# Patient Record
Sex: Female | Born: 1975
Health system: Southern US, Community
[De-identification: ages and names within clinical notes are randomized; demographics above are authoritative.]

## PROBLEM LIST (undated history)

## (undated) DIAGNOSIS — F419 Anxiety disorder, unspecified: Secondary | ICD-10-CM

## (undated) DIAGNOSIS — R569 Unspecified convulsions: Secondary | ICD-10-CM

## (undated) DIAGNOSIS — F429 Obsessive-compulsive disorder, unspecified: Secondary | ICD-10-CM

## (undated) HISTORY — PX: TONSILLECTOMY: SUR1361

## (undated) HISTORY — DX: Anxiety disorder, unspecified: F41.9

## (undated) HISTORY — DX: Unspecified convulsions: R56.9

## (undated) HISTORY — PX: BREAST LUMPECTOMY: SHX2

## (undated) HISTORY — DX: Obsessive-compulsive disorder, unspecified: F42.9

---

## 2004-01-07 ENCOUNTER — Other Ambulatory Visit: Admission: RE | Admit: 2004-01-07 | Discharge: 2004-01-07 | Payer: Self-pay | Admitting: Family Medicine

## 2004-12-01 ENCOUNTER — Other Ambulatory Visit: Admission: RE | Admit: 2004-12-01 | Discharge: 2004-12-01 | Payer: Self-pay | Admitting: Family Medicine

## 2006-03-03 ENCOUNTER — Other Ambulatory Visit: Admission: RE | Admit: 2006-03-03 | Discharge: 2006-03-03 | Payer: Self-pay | Admitting: Family Medicine

## 2006-05-10 ENCOUNTER — Ambulatory Visit: Payer: Self-pay | Admitting: Family Medicine

## 2006-06-29 ENCOUNTER — Ambulatory Visit: Payer: Self-pay | Admitting: Family Medicine

## 2006-09-10 ENCOUNTER — Ambulatory Visit: Payer: Self-pay | Admitting: Family Medicine

## 2007-02-09 ENCOUNTER — Ambulatory Visit: Payer: Self-pay | Admitting: Family Medicine

## 2007-03-17 ENCOUNTER — Ambulatory Visit: Payer: Self-pay | Admitting: Family Medicine

## 2007-03-17 ENCOUNTER — Other Ambulatory Visit: Admission: RE | Admit: 2007-03-17 | Discharge: 2007-03-17 | Payer: Self-pay | Admitting: Family Medicine

## 2007-09-09 ENCOUNTER — Ambulatory Visit: Payer: Self-pay | Admitting: Family Medicine

## 2008-04-04 ENCOUNTER — Ambulatory Visit: Payer: Self-pay | Admitting: Family Medicine

## 2008-06-15 ENCOUNTER — Ambulatory Visit: Payer: Self-pay | Admitting: Family Medicine

## 2008-06-15 ENCOUNTER — Other Ambulatory Visit: Admission: RE | Admit: 2008-06-15 | Discharge: 2008-06-15 | Payer: Self-pay | Admitting: Family Medicine

## 2009-04-17 ENCOUNTER — Ambulatory Visit: Payer: Self-pay | Admitting: Family Medicine

## 2009-07-03 ENCOUNTER — Ambulatory Visit: Payer: Self-pay | Admitting: Family Medicine

## 2009-07-30 ENCOUNTER — Other Ambulatory Visit: Admission: RE | Admit: 2009-07-30 | Discharge: 2009-07-30 | Payer: Self-pay | Admitting: Obstetrics and Gynecology

## 2009-10-08 ENCOUNTER — Encounter: Admission: RE | Admit: 2009-10-08 | Discharge: 2009-10-08 | Payer: Self-pay | Admitting: Obstetrics and Gynecology

## 2009-11-29 ENCOUNTER — Encounter: Admission: RE | Admit: 2009-11-29 | Discharge: 2009-11-29 | Payer: Self-pay | Admitting: Surgery

## 2009-12-19 ENCOUNTER — Ambulatory Visit (HOSPITAL_BASED_OUTPATIENT_CLINIC_OR_DEPARTMENT_OTHER): Admission: RE | Admit: 2009-12-19 | Discharge: 2009-12-19 | Payer: Self-pay | Admitting: Surgery

## 2010-07-31 ENCOUNTER — Other Ambulatory Visit: Admission: RE | Admit: 2010-07-31 | Discharge: 2010-07-31 | Payer: Self-pay | Admitting: Obstetrics and Gynecology

## 2011-08-04 ENCOUNTER — Other Ambulatory Visit (HOSPITAL_COMMUNITY)
Admission: RE | Admit: 2011-08-04 | Discharge: 2011-08-04 | Disposition: A | Discharge status: Home / Self Care | Payer: Managed Care, Other (non HMO) | Source: Ambulatory Visit | Attending: Obstetrics and Gynecology | Admitting: Obstetrics and Gynecology

## 2011-08-04 ENCOUNTER — Other Ambulatory Visit: Payer: Self-pay | Admitting: Nurse Practitioner

## 2011-08-04 DIAGNOSIS — Z01419 Encounter for gynecological examination (general) (routine) without abnormal findings: Secondary | ICD-10-CM | POA: Insufficient documentation

## 2012-09-14 ENCOUNTER — Other Ambulatory Visit: Payer: Self-pay | Admitting: Family Medicine

## 2012-09-14 ENCOUNTER — Other Ambulatory Visit (HOSPITAL_COMMUNITY)
Admission: RE | Admit: 2012-09-14 | Discharge: 2012-09-14 | Disposition: A | Discharge status: Home / Self Care | Payer: Managed Care, Other (non HMO) | Source: Ambulatory Visit | Attending: Family Medicine | Admitting: Family Medicine

## 2012-09-14 DIAGNOSIS — Z124 Encounter for screening for malignant neoplasm of cervix: Secondary | ICD-10-CM | POA: Insufficient documentation

## 2013-12-12 ENCOUNTER — Other Ambulatory Visit: Payer: Self-pay | Admitting: Family Medicine

## 2013-12-12 ENCOUNTER — Other Ambulatory Visit (HOSPITAL_COMMUNITY)
Admission: RE | Admit: 2013-12-12 | Discharge: 2013-12-12 | Disposition: A | Discharge status: Home / Self Care | Payer: Managed Care, Other (non HMO) | Source: Ambulatory Visit | Attending: Family Medicine | Admitting: Family Medicine

## 2013-12-12 DIAGNOSIS — Z1231 Encounter for screening mammogram for malignant neoplasm of breast: Secondary | ICD-10-CM

## 2013-12-12 DIAGNOSIS — Z124 Encounter for screening for malignant neoplasm of cervix: Secondary | ICD-10-CM | POA: Insufficient documentation

## 2013-12-12 DIAGNOSIS — Z1151 Encounter for screening for human papillomavirus (HPV): Secondary | ICD-10-CM | POA: Insufficient documentation

## 2013-12-28 ENCOUNTER — Ambulatory Visit: Payer: Managed Care, Other (non HMO)

## 2014-01-15 ENCOUNTER — Ambulatory Visit
Admission: RE | Admit: 2014-01-15 | Discharge: 2014-01-15 | Disposition: A | Discharge status: Home / Self Care | Payer: Managed Care, Other (non HMO) | Source: Ambulatory Visit | Attending: Family Medicine | Admitting: Family Medicine

## 2014-01-15 DIAGNOSIS — Z1231 Encounter for screening mammogram for malignant neoplasm of breast: Secondary | ICD-10-CM

## 2014-01-16 ENCOUNTER — Other Ambulatory Visit: Payer: Self-pay | Admitting: Family Medicine

## 2014-01-16 DIAGNOSIS — R928 Other abnormal and inconclusive findings on diagnostic imaging of breast: Secondary | ICD-10-CM

## 2014-01-29 ENCOUNTER — Ambulatory Visit
Admission: RE | Admit: 2014-01-29 | Discharge: 2014-01-29 | Disposition: A | Discharge status: Home / Self Care | Payer: Managed Care, Other (non HMO) | Source: Ambulatory Visit | Attending: Family Medicine | Admitting: Family Medicine

## 2014-01-29 DIAGNOSIS — R928 Other abnormal and inconclusive findings on diagnostic imaging of breast: Secondary | ICD-10-CM

## 2014-12-13 ENCOUNTER — Other Ambulatory Visit (HOSPITAL_COMMUNITY)
Admission: RE | Admit: 2014-12-13 | Payer: Managed Care, Other (non HMO) | Source: Ambulatory Visit | Admitting: Family Medicine

## 2014-12-13 ENCOUNTER — Other Ambulatory Visit: Payer: Self-pay | Admitting: Family Medicine

## 2014-12-13 DIAGNOSIS — Z124 Encounter for screening for malignant neoplasm of cervix: Secondary | ICD-10-CM | POA: Diagnosis present

## 2014-12-17 LAB — CYTOLOGY - PAP

## 2015-01-09 ENCOUNTER — Ambulatory Visit (INDEPENDENT_AMBULATORY_CARE_PROVIDER_SITE_OTHER): Payer: Managed Care, Other (non HMO) | Admitting: Neurology

## 2015-01-09 ENCOUNTER — Telehealth: Payer: Self-pay | Admitting: Neurology

## 2015-01-09 ENCOUNTER — Encounter: Payer: Self-pay | Admitting: Neurology

## 2015-01-09 VITALS — BP 116/70 | HR 72 | Ht 64.0 in | Wt 133.0 lb

## 2015-01-09 DIAGNOSIS — R569 Unspecified convulsions: Secondary | ICD-10-CM

## 2015-01-09 HISTORY — DX: Unspecified convulsions: R56.9

## 2015-01-09 NOTE — Telephone Encounter (Signed)
Pt is calling stating she thinks Dr. Anne HahnWillis told her to never take Wellburtin again.  She wants to make sure she understood correctly and if that is the case why can't she drive if she is not taking this medication if that is what caused the seizure.  Please call and advise.

## 2015-01-09 NOTE — Progress Notes (Signed)
Reason for visit: Seizure  Ashley Sweeney is a 39 y.o. female  History of present illness:  Ms. Klausner is a 39 year old right-handed white female with a history of obsessive compulsive disorder and anxiety. The patient had been on Wellbutrin for this issue, taking 300 mg daily. The patient increased the dose from 300 mg to 450 mg daily dose around the middle of February 2016. On the 26th of February, the patient was at work, alone in her office. The patient noted a period of lost time. The patient awakened to find that the computer had timed out, and she felt confused, she could not remember her pass code to get back into her computer. The patient then noted that she had become incontinent of the urine, and did not know why. When she went home that day, she noted an ecchymosis on the left forehead. The patient had not bitten her tongue. The patient did note muscle soreness over the next 2 days particularly involving the jaw, and the right arm. The patient sought medical attention through her primary care physician who suspected that a seizure event had occurred. The patient was taken off of her Wellbutrin, and MRI evaluation of the brain was done. This study appears to be normal. The report was made available to me. The patient was told not to operate a motor vehicle. She is sent to this office for an evaluation. She has never had seizures before, there is no family history of seizures.  Past Medical History  Diagnosis Date  . Anxiety   . Convulsions/seizures 01/09/2015  . OCD (obsessive compulsive disorder)     Past Surgical History  Procedure Laterality Date  . Breast lumpectomy  2011-2012  . Tonsillectomy      Family History  Problem Relation Age of Onset  . Prostate cancer Mother   . Diabetes Father   . Depression Father   . Diabetes Paternal Grandmother   . Diabetes Paternal Grandfather   . Seizures Neg Hx     Social history:  reports that she has quit smoking. She has never  used smokeless tobacco. She reports that she drinks alcohol. She reports that she does not use illicit drugs.  Medications:  Prior to Admission medications   Medication Sig Start Date End Date Taking? Authorizing Provider  SPRINTEC 28 0.25-35 MG-MCG tablet Take 1 tablet by mouth daily. 12/23/14  Yes Historical Provider, MD     No Known Allergies  ROS:  Out of a complete 14 system review of symptoms, the patient complains only of the following symptoms, and all other reviewed systems are negative.  Incontinence of bladder Memory loss, confusion, headache Dizziness, seizure  Blood pressure 116/70, pulse 72, height  (1.626 m), weight 133 lb (60.328 kg).  Physical Exam  General: The patient is alert and cooperative at the time of the examination.  Eyes: Pupils are equal, round, and reactive to light. Discs are flat bilaterally.  Neck: The neck is supple, no carotid bruits are noted.  Respiratory: The respiratory examination is clear.  Cardiovascular: The cardiovascular examination reveals a regular rate and rhythm, no obvious murmurs or rubs are noted.  Skin: Extremities are without significant edema.  Neurologic Exam  Mental status: The patient is alert and oriented x 3 at the time of the examination. The patient has apparent normal recent and remote memory, with an apparently normal attention span and concentration ability.  Cranial nerves: Facial symmetry is present. There is good sensation of the face  to pinprick and soft touch bilaterally. The strength of the facial muscles and the muscles to head turning and shoulder shrug are normal bilaterally. Speech is well enunciated, no aphasia or dysarthria is noted. Extraocular movements are full. Visual fields are full. The tongue is midline, and the patient has symmetric elevation of the soft palate. No obvious hearing deficits are noted.  Motor: The motor testing reveals 5 over 5 strength of all 4 extremities. Good symmetric  motor tone is noted throughout.  Sensory: Sensory testing is intact to pinprick, soft touch, vibration sensation, and position sense on all 4 extremities. No evidence of extinction is noted.  Coordination: Cerebellar testing reveals good finger-nose-finger and heel-to-shin bilaterally.  Gait and station: Gait is normal. Tandem gait is normal. Romberg is negative. No drift is seen.  Reflexes: Deep tendon reflexes are symmetric and normal bilaterally. Toes are downgoing bilaterally.   Assessment/Plan:  1. Seizure event  2. Obsessive-compulsive disorder  The patient likely had a provoked seizure episode. The seizure was likely brought on by a high dose of Wellbutrin. This medication has been stopped, and MRI brain evaluation has been done that was normal. The patient will be set up for an EEG study. If this is normal, the patient will be watched conservatively over the next 6 months. She is not to operate a motor vehicle for 6 months. At that point, she may return to driving if no further episodes have occurred. The patient will follow-up in 6 months.  Marlan Palau. Keith Willis MD 01/09/2015 9:09 PM  [facility] [address] [place], [place] [id number]  Phone [phone] Fax [phone]

## 2015-01-09 NOTE — Patient Instructions (Signed)

## 2015-01-09 NOTE — Telephone Encounter (Signed)
I called the patient. The patient will need to have a 6 month waiting period regardless of whether she goes on medication or not. I think that it is possible that the Wellbutrin was the cause of the seizure, I cannot be sure of this. If the EEG study is abnormal, she will be placed on seizure medications, she will still have to wait 6 months to drive. If the EEG is normal, she will still have to wait 6 months to drive, if she wishes to go back on Wellbutrin she may, but she must understand that being on Wellbutrin does increase the risk of having another seizure. It is possible that being on a lower dose such as 150 mg daily for 300 mg daily of Wellbutrin may be tolerated without recurrence of seizures.

## 2015-01-16 ENCOUNTER — Ambulatory Visit (INDEPENDENT_AMBULATORY_CARE_PROVIDER_SITE_OTHER): Payer: Managed Care, Other (non HMO) | Admitting: Neurology

## 2015-01-16 ENCOUNTER — Telehealth: Payer: Self-pay | Admitting: Neurology

## 2015-01-16 DIAGNOSIS — R569 Unspecified convulsions: Secondary | ICD-10-CM

## 2015-01-16 NOTE — Procedures (Signed)
    History:  Ashley Sweeney is a [age] year old patient with a history of anxiety and OCD disorder on Wellbutrin. The patient had gone to a 450 mg daily dose of the medication, and on February 26, she experienced an episode of "lost time" likely representing an unwitnessed seizure. The patient is being evaluated for this episode.  This is a routine EEG. No skull defects are noted. Medications include birth control pills.   EEG classification: Normal awake and asleep  Description of the recording: The background rhythms of this recording consists of a fairly well modulated medium amplitude background activity of 9 Hz. As the record progresses, the patient initially is in the waking state, but appears to enter the early stage II sleep during the recording, with rudimentary sleep spindles and vertex sharp wave activity seen. During the wakeful state, photic stimulation is performed, and this results in a bilateral and symmetric photic driving response. Hyperventilation was also performed, and this results in a minimal buildup of the background rhythm activities without significant slowing seen. At no time during the recording does there appear to be evidence of spike or spike wave discharges or evidence of focal slowing. EKG monitor shows no evidence of cardiac rhythm abnormalities with a heart rate of 84.  Impression: This is a normal EEG recording in the waking and sleeping state. No evidence of ictal or interictal discharges were seen at any time during the recording.

## 2015-01-16 NOTE — Telephone Encounter (Signed)
I called patient. The brain wave test was unremarkable. At this point, we will watch the patient with a neck several months, she has no seizures after 6 months, she may return to driving.

## 2015-07-10 ENCOUNTER — Encounter: Payer: Self-pay | Admitting: Neurology

## 2015-07-10 ENCOUNTER — Ambulatory Visit (INDEPENDENT_AMBULATORY_CARE_PROVIDER_SITE_OTHER): Payer: Managed Care, Other (non HMO) | Admitting: Neurology

## 2015-07-10 VITALS — BP 119/78 | HR 72 | Ht 63.0 in | Wt 145.5 lb

## 2015-07-10 DIAGNOSIS — R569 Unspecified convulsions: Secondary | ICD-10-CM

## 2015-07-10 NOTE — Patient Instructions (Signed)
   Please call if you have had another seizure type episode.

## 2015-07-10 NOTE — Progress Notes (Signed)
Reason for visit: Seizures  Ashley Sweeney is an 39 y.o. female  History of present illness:  Ashley Sweeney is a 39 year old right-handed white female with a history of a single seizure event that occurred on 12/28/2014. The patient had been on Wellbutrin 450 mg daily at the time of the seizure. The patient has undergone MRI evaluation of the brain that was unremarkable, and an EEG study was normal. The patient had gone off of Wellbutrin and, but apparently she has not done well in regards to her depression issue. The patient is now back on Wellbutrin on the short acting tablets taking 75 mg twice daily. She indicates that she has had no further seizure-type episodes. She returns for an evaluation.  Past Medical History  Diagnosis Date  . Anxiety   . Convulsions/seizures 01/09/2015  . OCD (obsessive compulsive disorder)     Past Surgical History  Procedure Laterality Date  . Breast lumpectomy  2011-2012  . Tonsillectomy      Family History  Problem Relation Age of Onset  . Prostate cancer Mother   . Diabetes Father   . Depression Father   . Diabetes Paternal Grandmother   . Diabetes Paternal Grandfather   . Seizures Neg Hx   . Breast cancer Cousin     Social history:  reports that she has quit smoking. She has never used smokeless tobacco. She reports that she drinks about 4.2 oz of alcohol per week. She reports that she does not use illicit drugs.   No Known Allergies  Medications:  Prior to Admission medications   Medication Sig Start Date End Date Taking? Authorizing Provider  buPROPion (WELLBUTRIN XL) 150 MG 24 hr tablet Take 150 mg by mouth daily.   Yes Historical Provider, MD  SPRINTEC 28 0.25-35 MG-MCG tablet Take 1 tablet by mouth daily. 12/23/14  Yes Historical Provider, MD    ROS:  Out of a complete 14 system review of symptoms, the patient complains only of the following symptoms, and all other reviewed systems are negative.  Depression, anxiety  Blood  pressure 119/78, pulse 72, height  (1.6 m), weight 145 lb 8 oz (65.998 kg).  Physical Exam  General: The patient is alert and cooperative at the time of the examination.  Skin: No significant peripheral edema is noted.   Neurologic Exam  Mental status: The patient is alert and oriented x 3 at the time of the examination. The patient has apparent normal recent and remote memory, with an apparently normal attention span and concentration ability.   Cranial nerves: Facial symmetry is present. Speech is normal, no aphasia or dysarthria is noted. Extraocular movements are full. Visual fields are full.  Motor: The patient has good strength in all 4 extremities.  Sensory examination: Soft touch sensation is symmetric on the face, arms, and legs.  Coordination: The patient has good finger-nose-finger and heel-to-shin bilaterally.  Gait and station: The patient has a normal gait. Tandem gait is normal. Romberg is negative. No drift is seen.  Reflexes: Deep tendon reflexes are symmetric.   Assessment/Plan:  1. Seizure event  The patient is now back on Wellbutrin, she is on the short acting tablet. If she is forced to use his medication, I would use the lowest dose that is effective. I would also recommend switching to the extended-release preparation of the medication, as this has a lower incidence of seizure induction. The patient may return to driving at this point, she will follow-up through this office  on an as-needed basis.  Marlan Palau MD 07/10/2015 8:31 PM  [facility] [address] [place], [place] [address]  Phone [phone] Fax [phone]

## 2016-08-11 ENCOUNTER — Encounter (HOSPITAL_COMMUNITY): Payer: Self-pay | Admitting: Emergency Medicine

## 2016-08-11 ENCOUNTER — Emergency Department (HOSPITAL_COMMUNITY): Payer: BLUE CROSS/BLUE SHIELD

## 2016-08-11 ENCOUNTER — Emergency Department (HOSPITAL_COMMUNITY)
Admission: EM | Admit: 2016-08-11 | Discharge: 2016-08-11 | Disposition: A | Discharge status: Home / Self Care | Payer: BLUE CROSS/BLUE SHIELD | Attending: Emergency Medicine | Admitting: Emergency Medicine

## 2016-08-11 DIAGNOSIS — R109 Unspecified abdominal pain: Secondary | ICD-10-CM | POA: Diagnosis present

## 2016-08-11 DIAGNOSIS — Z79899 Other long term (current) drug therapy: Secondary | ICD-10-CM | POA: Insufficient documentation

## 2016-08-11 DIAGNOSIS — Z87891 Personal history of nicotine dependence: Secondary | ICD-10-CM | POA: Insufficient documentation

## 2016-08-11 LAB — URINALYSIS, ROUTINE W REFLEX MICROSCOPIC
BILIRUBIN URINE: NEGATIVE
Glucose, UA: NEGATIVE mg/dL
Hgb urine dipstick: NEGATIVE
Ketones, ur: NEGATIVE mg/dL
LEUKOCYTES UA: NEGATIVE
NITRITE: NEGATIVE
PH: 6 (ref 5.0–8.0)
Protein, ur: NEGATIVE mg/dL
SPECIFIC GRAVITY, URINE: 1.029 (ref 1.005–1.030)

## 2016-08-11 LAB — CBC
HCT: 38.6 % (ref 36.0–46.0)
Hemoglobin: 12.7 g/dL (ref 12.0–15.0)
MCH: 28.7 pg (ref 26.0–34.0)
MCHC: 32.9 g/dL (ref 30.0–36.0)
MCV: 87.3 fL (ref 78.0–100.0)
PLATELETS: 328 10*3/uL (ref 150–400)
RBC: 4.42 MIL/uL (ref 3.87–5.11)
RDW: 12.7 % (ref 11.5–15.5)
WBC: 12.5 10*3/uL — ABNORMAL HIGH (ref 4.0–10.5)

## 2016-08-11 LAB — BASIC METABOLIC PANEL
Anion gap: 7 (ref 5–15)
BUN: 16 mg/dL (ref 6–20)
CHLORIDE: 107 mmol/L (ref 101–111)
CO2: 25 mmol/L (ref 22–32)
CREATININE: 0.7 mg/dL (ref 0.44–1.00)
Calcium: 8.9 mg/dL (ref 8.9–10.3)
GFR calc non Af Amer: >60 mL/min (ref >60)
GLUCOSE: 138 mg/dL — AB (ref 65–99)
Potassium: 3.9 mmol/L (ref 3.5–5.1)
Sodium: 139 mmol/L (ref 135–145)

## 2016-08-11 LAB — I-STAT TROPONIN, ED: Troponin i, poc: 0.01 ng/mL (ref 0.00–0.08)

## 2016-08-11 LAB — PREGNANCY, URINE: Preg Test, Ur: NEGATIVE

## 2016-08-11 MED ORDER — GI COCKTAIL ~~LOC~~: 30.0000 mL | Freq: Once | ORAL | Status: DC

## 2016-08-11 MED ORDER — ONDANSETRON HCL 4 MG PO TABS: 4.0000 mg | ORAL_TABLET | Freq: Four times a day (QID) | ORAL | 0 refills | Status: AC | PRN

## 2016-08-11 MED ORDER — PANTOPRAZOLE SODIUM 40 MG PO TBEC
40.0000 mg | DELAYED_RELEASE_TABLET | Freq: Once | ORAL | Status: AC
  Administered 2016-08-11: 40 mg via ORAL
  Filled 2016-08-11: qty 1

## 2016-08-11 MED ORDER — ONDANSETRON HCL 4 MG/2ML IJ SOLN
4.0000 mg | Freq: Once | INTRAMUSCULAR | Status: AC
  Administered 2016-08-11: 4 mg via INTRAVENOUS
  Filled 2016-08-11: qty 2

## 2016-08-11 MED ORDER — MORPHINE SULFATE (PF) 4 MG/ML IV SOLN
4.0000 mg | Freq: Once | INTRAVENOUS | Status: AC
Start: 2016-08-11 — End: 2016-08-11
  Administered 2016-08-11: 4 mg via INTRAVENOUS
  Filled 2016-08-11: qty 1

## 2016-08-11 MED ORDER — PANTOPRAZOLE SODIUM 40 MG PO TBEC: 40.0000 mg | DELAYED_RELEASE_TABLET | Freq: Every day | ORAL | 0 refills | Status: AC

## 2016-08-11 MED ORDER — KETOROLAC TROMETHAMINE 30 MG/ML IJ SOLN
30.0000 mg | Freq: Once | INTRAMUSCULAR | Status: AC
  Administered 2016-08-11: 30 mg via INTRAVENOUS
  Filled 2016-08-11: qty 1

## 2016-08-11 MED ORDER — OXYCODONE-ACETAMINOPHEN 5-325 MG PO TABS: 1.0000 | ORAL_TABLET | ORAL | 0 refills | Status: AC | PRN

## 2016-08-11 NOTE — ED Provider Notes (Signed)
[facility]-EMERGENCY DEPT Provider Note   CSN: [id number] Arrival date & time: 08/11/16  0411     History   Chief Complaint Chief Complaint  Patient presents with  . Chest Pain  . Flank Pain    HPI Ashley Sweeney is a 40 y.o. female.  She has a history of seizures. She woke up at about 3:30 AM with pain in the right side of her back and into her chest. Pain is worse in the lower rib cage but does takes and both superiorly and inferiorly. There was no definite dyspnea. She tried drinking a lot of water and ate some Tums and didn't vomit. She momentarily felt better but then pain got worse again. She rates pain at 6/10. She denies fever or chills but has broken out in sweats. She denies any urinary difficulty. She's never had pain like this before.   The history is provided by the patient.    Past Medical History:  Diagnosis Date  . Anxiety   . Convulsions/seizures (HCC) 01/09/2015  . OCD (obsessive compulsive disorder)     Patient Active Problem List   Diagnosis Date Noted  . Convulsions/seizures (HCC) 01/09/2015    Past Surgical History:  Procedure Laterality Date  . BREAST LUMPECTOMY  2011-2012  . TONSILLECTOMY      OB History    No data available       Home Medications    Prior to Admission medications   Medication Sig Start Date End Date Taking? Authorizing Provider  buPROPion (WELLBUTRIN XL) 150 MG 24 hr tablet Take 150 mg by mouth daily.    Historical Provider, MD  SPRINTEC 28 0.25-35 MG-MCG tablet Take 1 tablet by mouth daily. 12/23/14   Historical Provider, MD    Family History Family History  Problem Relation Age of Onset  . Prostate cancer Mother   . Diabetes Father   . Depression Father   . Diabetes Paternal Grandmother   . Diabetes Paternal Grandfather   . Breast cancer Cousin   . Seizures Neg Hx     Social History Social History  Substance Use Topics  . Smoking status: Former Smoker    Quit date: 08/02/2010  . Smokeless tobacco: Never  Used  . Alcohol use 0.0 oz/week     Comment: 1-2 glasses of wine a night     Allergies   Review of patient's allergies indicates no known allergies.   Review of Systems Review of Systems  All other systems reviewed and are negative.    Physical Exam Updated Vital Signs BP 99/86 (BP Location: Left Arm)   Pulse 77   Temp 97.6 F (36.4 C)   Resp 15   Ht 5\' 3"  (1.6 m)   Wt 140 lb (63.5 kg)   LMP 07/28/2016 (Approximate)   SpO2 100%   BMI 24.80 kg/m   Physical Exam  Nursing note and vitals reviewed.  40 year old female, resting comfortably and in no acute distress. Vital signs are Normal. Oxygen saturation is 100%, which is normal. Head is normocephalic and atraumatic. PERRLA, EOMI. Oropharynx is clear. Neck is nontender and supple without adenopathy or JVD. Back is nontender in the midline. There is mild right CVA tenderness. Lungs are clear without rales, wheezes, or rhonchi. Chest is nontender. Heart has regular rate and rhythm without murmur. Abdomen is soft, flat, nontender without masses or hepatosplenomegaly and peristalsis is normoactive. Extremities have no cyanosis or edema, full range of motion is present. Skin is warm and dry without  rash. Neurologic: Mental status is normal, cranial nerves are intact, there are no motor or sensory deficits.  ED Treatments / Results  Labs (all labs ordered are listed, but only abnormal results are displayed) Labs Reviewed  BASIC METABOLIC PANEL - Abnormal; Notable for the following:       Result Value   Glucose, Bld 138 (*)    All other components within normal limits  CBC - Abnormal; Notable for the following:    WBC 12.5 (*)    All other components within normal limits  URINALYSIS, ROUTINE W REFLEX MICROSCOPIC (NOT AT [facility]) - Abnormal; Notable for the following:    APPearance CLOUDY (*)    All other components within normal limits  PREGNANCY, URINE  I-STAT TROPOININ, ED  POC URINE PREG, ED    EKG  EKG  Interpretation  Date/Time:  Tuesday August 11 2016 04:20:17 EDT Ventricular Rate:  67 PR Interval:    QRS Duration: 75 QT Interval:  410 QTC Calculation: 433 R Axis:   77 Text Interpretation:  Sinus rhythm Borderline prolonged PR interval Otherwise within normal limits No old tracing to compare Confirmed by [facility]  MD, Aaniyah Strohm ([id number]) on 08/11/2016 4:37:55 AM       Radiology Dg Chest 2 View  Result Date: 08/11/2016 CLINICAL DATA:  Awoke with chest pain. EXAM: CHEST  2 VIEW COMPARISON:  None. FINDINGS: The cardiomediastinal contours are normal. The lungs are clear. Pulmonary vasculature is normal. No consolidation, pleural effusion, or pneumothorax. No acute osseous abnormalities are seen. IMPRESSION: No acute pulmonary process. Electronically Signed   By: Rubye Oaks M.D.   On: 08/11/2016 04:37    Procedures Procedures (including critical care time)  Medications Ordered in ED Medications  gi cocktail (Maalox,Lidocaine,Donnatal) (30 mLs Oral Not Given 08/11/16 0757)  ketorolac (TORADOL) 30 MG/ML injection 30 mg (30 mg Intravenous Given 08/11/16 0524)  ondansetron (ZOFRAN) injection 4 mg (4 mg Intravenous Given 08/11/16 0524)  morphine 4 MG/ML injection 4 mg (4 mg Intravenous Given 08/11/16 0741)  pantoprazole (PROTONIX) EC tablet 40 mg (40 mg Oral Given 08/11/16 1914)     Initial Impression / Assessment and Plan / ED Course  I have reviewed the triage vital signs and the nursing notes.  Pertinent labs & imaging results that were available during my care of the patient were reviewed by me and considered in my medical decision making (see chart for details).  Clinical Course   Back pain which does seem to be localized in the region around the lower rib cage/costovertebral angle area. I am suspicious this may be renal colic. ECG is unremarkable and chest x-ray is normal. She will be given ketorolac and ondansetron and sent for renal stone protocol CT scan.  Workup has come  back unremarkable. CT shows no evidence of urolithiasis. Urinalysis is unremarkable. CBC and metabolic panel were unremarkable. Patient states that pain has now moved to more in her epigastric area although still somewhat in her back but closer to the midline. On reexam, abdomen is tender in the epigastric area. I am suspicious that symptoms are related to GERD or ulcers or gastritis. GI cocktail was ordered but patient has refused. She was given a dose of morphine with significant relief of pain. Abdomen is still benign and workup has not shown any evidence of any serious pathology. She is discharged with prescription for pantoprazole as well as a small number of ondansetron and oxycodone-acetaminophen. Follow-up with PCP in 2 days with strict return precautions given.  Final  Clinical Impressions(s) / ED Diagnoses   Final diagnoses:  Right flank pain  Abdominal pain, unspecified abdominal location    New Prescriptions New Prescriptions   ONDANSETRON (ZOFRAN) 4 MG TABLET    Take 1 tablet (4 mg total) by mouth every 6 (six) hours as needed for nausea or vomiting.   OXYCODONE-ACETAMINOPHEN (PERCOCET) 5-325 MG TABLET    Take 1 tablet by mouth every 4 (four) hours as needed for moderate pain.   PANTOPRAZOLE (PROTONIX) 40 MG TABLET    Take 1 tablet (40 mg total) by mouth daily.     Dione Booze, MD 08/11/16 [phone]

## 2016-08-11 NOTE — ED Notes (Signed)
Discharge instructions, follow up care, and rx x3 reviewed with patient. Patient verbalized understanding. 

## 2016-08-11 NOTE — Discharge Instructions (Signed)
Return if symptoms are getting worse. °

## 2016-08-11 NOTE — ED Triage Notes (Signed)
Pt comes from home with complaints of chest pain and right sided flank pain that began upon awakening this morning around 0345.  Stated she also became very thirsty and drank a lot of water which ultimately caused her to throw up.  Pt states she has some shortness of breath and has a history of smoking.

## 2016-08-13 ENCOUNTER — Other Ambulatory Visit: Payer: Self-pay | Admitting: Family Medicine

## 2016-08-13 DIAGNOSIS — R1011 Right upper quadrant pain: Secondary | ICD-10-CM

## 2016-08-14 ENCOUNTER — Ambulatory Visit
Admission: RE | Admit: 2016-08-14 | Discharge: 2016-08-14 | Disposition: A | Discharge status: Home / Self Care | Payer: BLUE CROSS/BLUE SHIELD | Source: Ambulatory Visit | Attending: Family Medicine | Admitting: Family Medicine

## 2016-08-14 DIAGNOSIS — R1011 Right upper quadrant pain: Secondary | ICD-10-CM

## 2016-10-16 ENCOUNTER — Other Ambulatory Visit: Payer: Self-pay | Admitting: General Surgery

## 2017-03-17 ENCOUNTER — Other Ambulatory Visit: Payer: Self-pay | Admitting: Family Medicine

## 2017-03-17 DIAGNOSIS — Z1231 Encounter for screening mammogram for malignant neoplasm of breast: Secondary | ICD-10-CM

## 2018-06-24 ENCOUNTER — Other Ambulatory Visit: Payer: Self-pay | Admitting: Family Medicine

## 2018-06-24 DIAGNOSIS — Z1231 Encounter for screening mammogram for malignant neoplasm of breast: Secondary | ICD-10-CM

## 2018-09-01 ENCOUNTER — Ambulatory Visit
Admission: RE | Admit: 2018-09-01 | Discharge: 2018-09-01 | Disposition: A | Discharge status: Home / Self Care | Payer: Commercial Managed Care - PPO | Source: Ambulatory Visit | Attending: Family Medicine | Admitting: Family Medicine

## 2018-09-01 DIAGNOSIS — Z1231 Encounter for screening mammogram for malignant neoplasm of breast: Secondary | ICD-10-CM

## 2019-08-14 ENCOUNTER — Other Ambulatory Visit: Payer: Self-pay | Admitting: Family Medicine

## 2019-08-14 DIAGNOSIS — Z1231 Encounter for screening mammogram for malignant neoplasm of breast: Secondary | ICD-10-CM

## 2019-09-13 ENCOUNTER — Other Ambulatory Visit: Payer: Self-pay

## 2019-09-13 DIAGNOSIS — Z20822 Contact with and (suspected) exposure to covid-19: Secondary | ICD-10-CM

## 2019-09-15 LAB — NOVEL CORONAVIRUS, NAA: SARS-CoV-2, NAA: NOT DETECTED

## 2019-09-27 ENCOUNTER — Other Ambulatory Visit: Payer: Self-pay

## 2019-09-27 ENCOUNTER — Ambulatory Visit
Admission: RE | Admit: 2019-09-27 | Discharge: 2019-09-27 | Disposition: A | Discharge status: Home / Self Care | Payer: Commercial Managed Care - PPO | Source: Ambulatory Visit | Attending: Family Medicine | Admitting: Family Medicine

## 2019-09-27 DIAGNOSIS — Z1231 Encounter for screening mammogram for malignant neoplasm of breast: Secondary | ICD-10-CM

## 2020-02-02 ENCOUNTER — Ambulatory Visit: Payer: Commercial Managed Care - PPO | Attending: Internal Medicine

## 2020-02-02 DIAGNOSIS — Z23 Encounter for immunization: Secondary | ICD-10-CM

## 2020-02-02 NOTE — Progress Notes (Signed)
   Covid-19 Vaccination Clinic  Name:  Ashley Sweeney    MRN: [id number] DOB: November 21, 1975  02/02/2020  [name] was observed post Covid-19 immunization for 15 minutes without incident. She was provided with Vaccine Information Sheet and instruction to access the V-Safe system.   [name] was instructed to call 911 with any severe reactions post vaccine: Marland Kitchen Difficulty breathing  . Swelling of face and throat  . A fast heartbeat  . A bad rash all over body  . Dizziness and weakness   Immunizations Administered    Name Date Dose VIS Date Route   Pfizer COVID-19 Vaccine 02/02/2020  2:58 PM 0.3 mL 10/13/2019 Intramuscular   Manufacturer: [employer], [employer]   Lot: [id number]   NDC: [address]-2

## 2020-02-26 ENCOUNTER — Ambulatory Visit: Payer: Commercial Managed Care - PPO | Attending: Internal Medicine

## 2020-02-26 DIAGNOSIS — Z23 Encounter for immunization: Secondary | ICD-10-CM

## 2020-02-26 NOTE — Progress Notes (Signed)
   Covid-19 Vaccination Clinic  Name:  Ashley Sweeney    MRN: [id number] DOB: [birth year]  02/26/2020  [name] was observed post Covid-19 immunization for 15 minutes without incident. She was provided with Vaccine Information Sheet and instruction to access the V-Safe system.   [name] was instructed to call 911 with any severe reactions post vaccine: Marland Kitchen Difficulty breathing  . Swelling of face and throat  . A fast heartbeat  . A bad rash all over body  . Dizziness and weakness   Immunizations Administered    Name Date Dose VIS Date Route   Pfizer COVID-19 Vaccine 02/26/2020  2:57 PM 0.3 mL 12/27/2018 Intramuscular   Manufacturer: [employer], [employer]   Lot: [id number]   NDC: [address]-7

## 2020-04-12 ENCOUNTER — Other Ambulatory Visit (HOSPITAL_COMMUNITY)
Admission: RE | Admit: 2020-04-12 | Discharge: 2020-04-12 | Disposition: A | Discharge status: Home / Self Care | Payer: Commercial Managed Care - PPO | Source: Ambulatory Visit | Attending: Family Medicine | Admitting: Family Medicine

## 2020-04-12 ENCOUNTER — Other Ambulatory Visit: Payer: Self-pay | Admitting: Family Medicine

## 2020-04-12 DIAGNOSIS — Z124 Encounter for screening for malignant neoplasm of cervix: Secondary | ICD-10-CM | POA: Insufficient documentation

## 2020-04-16 LAB — CYTOLOGY - PAP
Comment: NEGATIVE
Diagnosis: NEGATIVE
High risk HPV: NEGATIVE

## 2020-04-17 ENCOUNTER — Other Ambulatory Visit: Payer: Self-pay | Admitting: Family Medicine

## 2020-04-17 DIAGNOSIS — Z1231 Encounter for screening mammogram for malignant neoplasm of breast: Secondary | ICD-10-CM

## 2020-09-30 ENCOUNTER — Other Ambulatory Visit: Payer: Self-pay

## 2020-09-30 ENCOUNTER — Ambulatory Visit
Admission: RE | Admit: 2020-09-30 | Discharge: 2020-09-30 | Disposition: A | Discharge status: Home / Self Care | Payer: Commercial Managed Care - PPO | Source: Ambulatory Visit | Attending: Family Medicine | Admitting: Family Medicine

## 2020-09-30 DIAGNOSIS — Z1231 Encounter for screening mammogram for malignant neoplasm of breast: Secondary | ICD-10-CM

## 2021-09-03 ENCOUNTER — Other Ambulatory Visit: Payer: Self-pay | Admitting: Family Medicine

## 2021-09-03 DIAGNOSIS — Z1231 Encounter for screening mammogram for malignant neoplasm of breast: Secondary | ICD-10-CM

## 2021-10-07 ENCOUNTER — Other Ambulatory Visit: Payer: Self-pay

## 2021-10-07 ENCOUNTER — Ambulatory Visit
Admission: RE | Admit: 2021-10-07 | Discharge: 2021-10-07 | Disposition: A | Discharge status: Home / Self Care | Payer: Commercial Managed Care - PPO | Source: Ambulatory Visit | Attending: Family Medicine | Admitting: Family Medicine

## 2021-10-07 DIAGNOSIS — Z1231 Encounter for screening mammogram for malignant neoplasm of breast: Secondary | ICD-10-CM

## 2022-02-19 IMAGING — MG MM DIGITAL SCREENING BILAT W/ TOMO AND CAD
8 series · 8 of 24 positions shown · non-contrast
Comparison: Previous exam(s).

CLINICAL DATA: Screening.

EXAM:
DIGITAL SCREENING BILATERAL MAMMOGRAM WITH TOMOSYNTHESIS AND CAD
TECHNIQUE: Bilateral screening digital craniocaudal and mediolateral oblique
mammograms were obtained. Bilateral screening digital breast
tomosynthesis was performed. The images were evaluated with
computer-aided detection.

[R CC synth-2D]
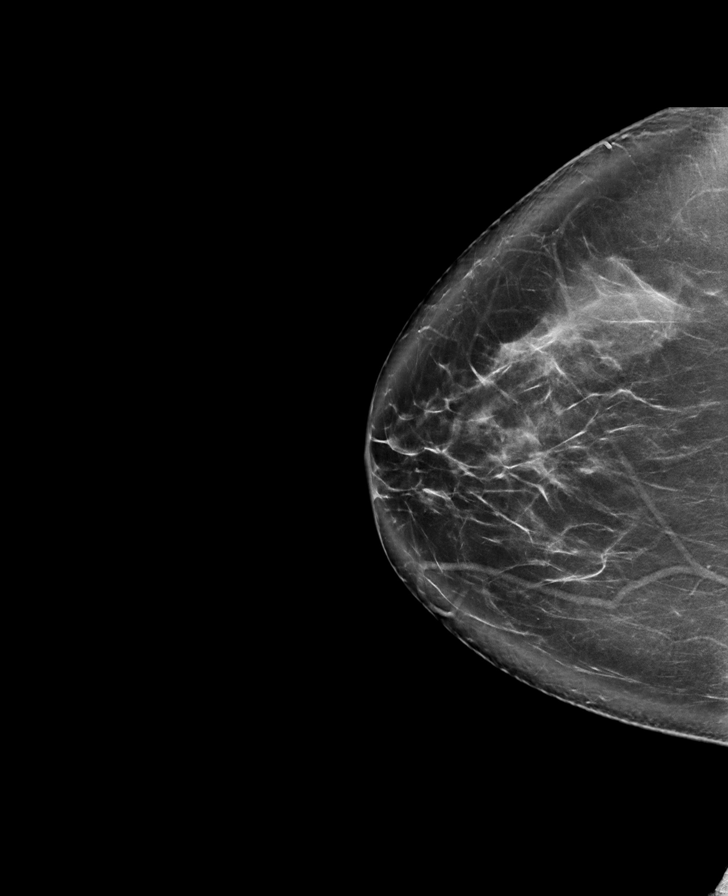

[L CC synth-2D]
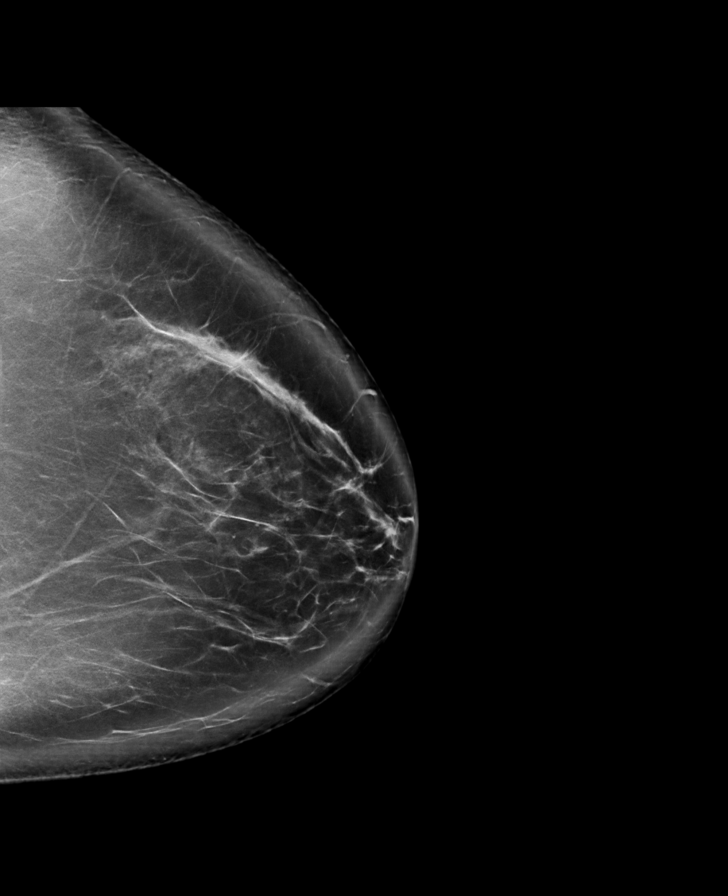

[L MLO synth-2D]
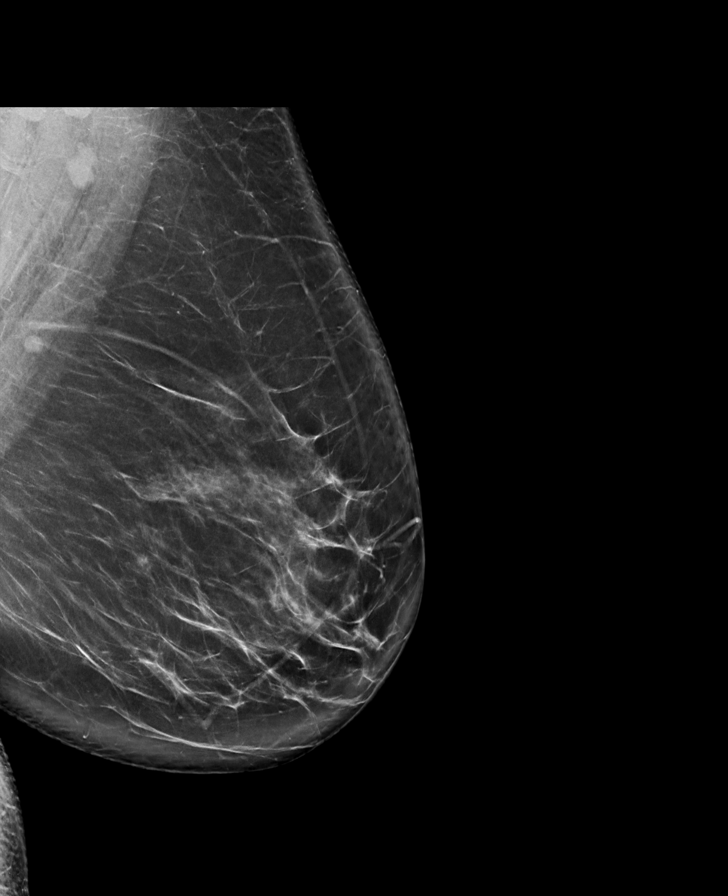

[R MLO synth-2D]
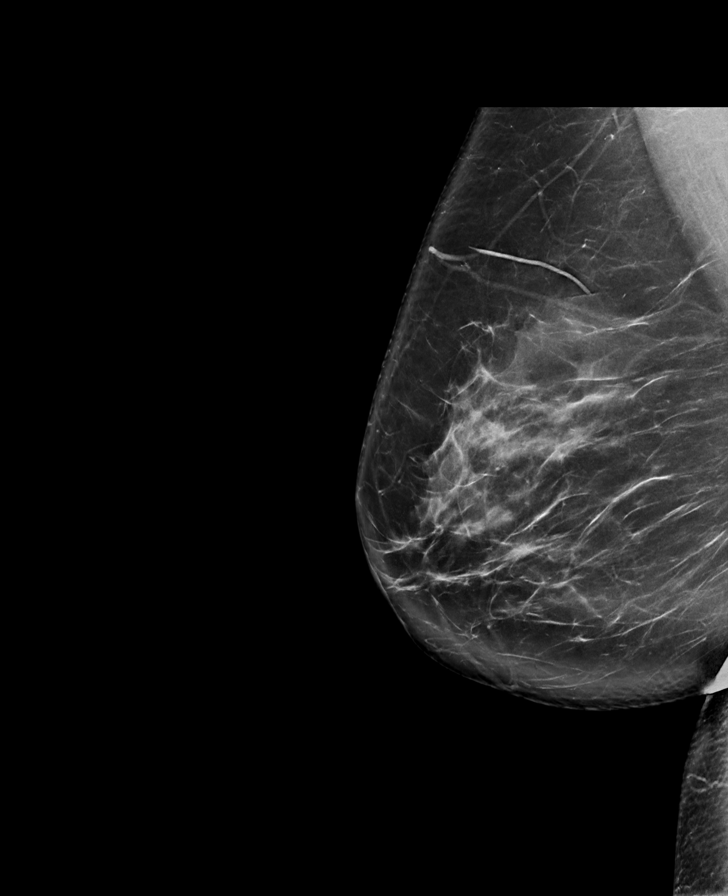

[L CC tomo · tomo slice 53/106.0]
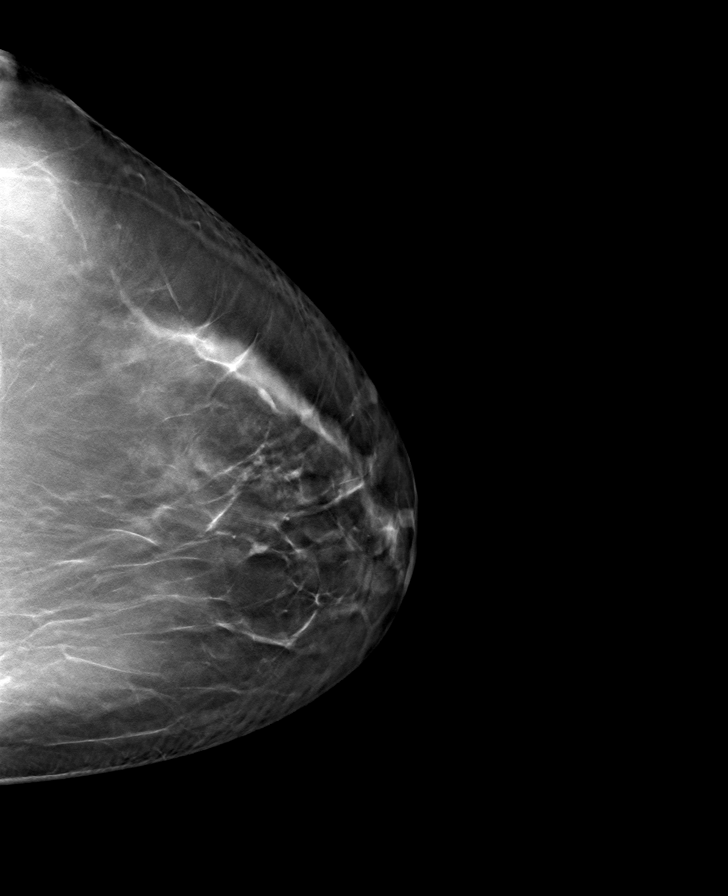

[R MLO tomo · tomo slice 51/100.0]
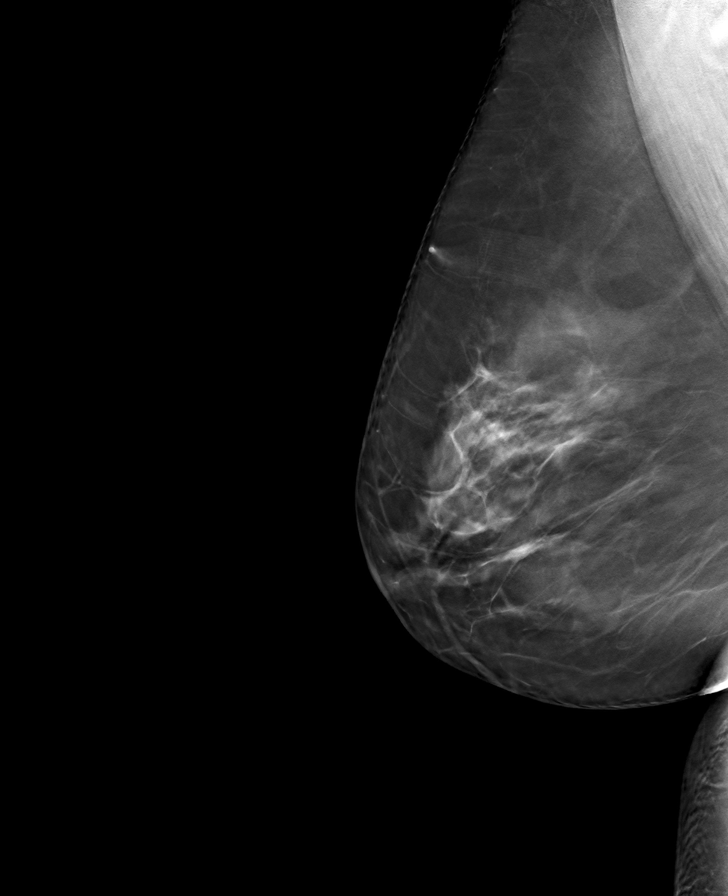

[L MLO tomo · tomo slice 50/99.0]
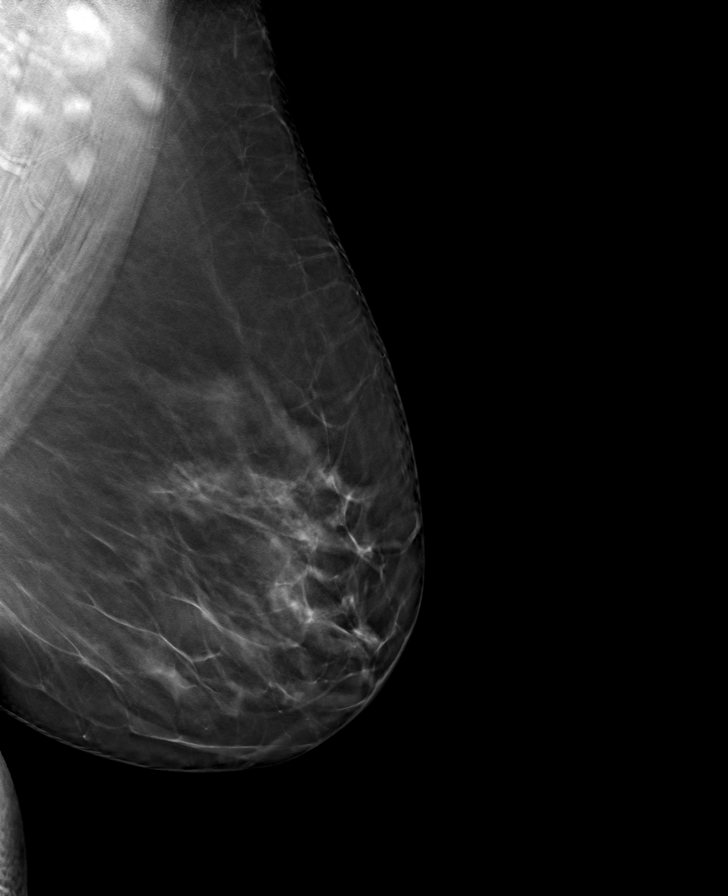

[R CC tomo · tomo slice 50/99.0]
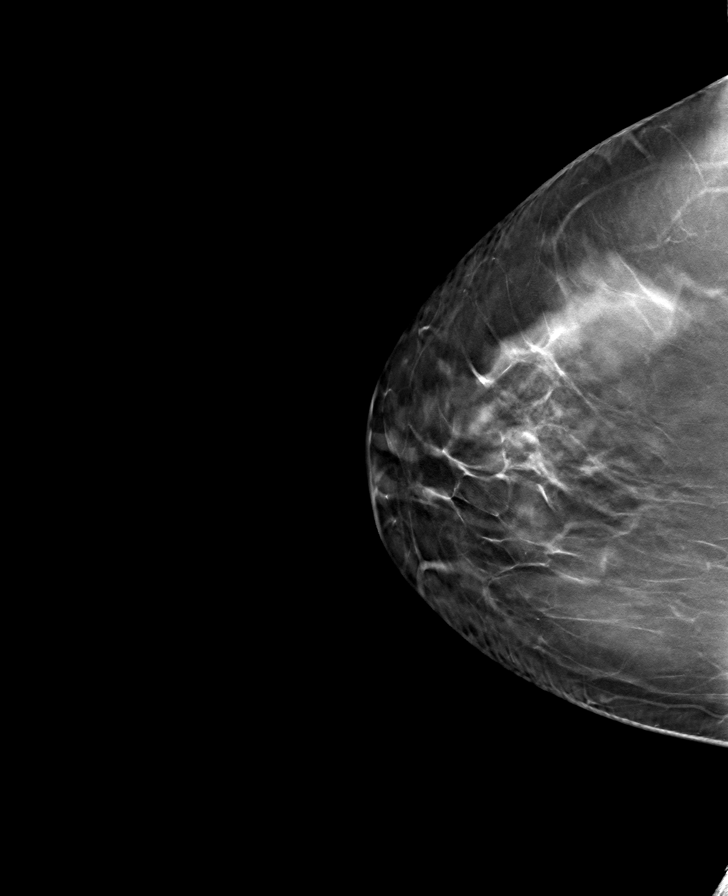

[8 of 24 positions shown; findings below may reference images not displayed]

ACR Breast Density Category b: There are scattered areas of
fibroglandular density.
FINDINGS: There are no findings suspicious for malignancy.
IMPRESSION: No mammographic evidence of malignancy. A result letter of this
screening mammogram will be mailed directly to the patient.

RECOMMENDATION:
Screening mammogram in one year. (Code:51-O-LD2)

BI-RADS CATEGORY  1: Negative.

## 2022-09-07 ENCOUNTER — Other Ambulatory Visit: Payer: Self-pay | Admitting: Family Medicine

## 2022-09-07 DIAGNOSIS — Z1231 Encounter for screening mammogram for malignant neoplasm of breast: Secondary | ICD-10-CM

## 2022-11-05 ENCOUNTER — Ambulatory Visit
Admission: RE | Admit: 2022-11-05 | Discharge: 2022-11-05 | Disposition: A | Discharge status: Home / Self Care | Payer: Commercial Managed Care - PPO | Source: Ambulatory Visit | Attending: Family Medicine | Admitting: Family Medicine

## 2022-11-05 DIAGNOSIS — Z1231 Encounter for screening mammogram for malignant neoplasm of breast: Secondary | ICD-10-CM

## 2023-11-01 ENCOUNTER — Other Ambulatory Visit: Payer: Self-pay | Admitting: Family Medicine

## 2023-11-01 DIAGNOSIS — Z1231 Encounter for screening mammogram for malignant neoplasm of breast: Secondary | ICD-10-CM

## 2023-11-19 ENCOUNTER — Ambulatory Visit: Payer: Commercial Managed Care - PPO

## 2024-01-10 ENCOUNTER — Ambulatory Visit: Payer: Commercial Managed Care - PPO

## 2024-01-19 ENCOUNTER — Ambulatory Visit
Admission: RE | Admit: 2024-01-19 | Discharge: 2024-01-19 | Disposition: A | Discharge status: Home / Self Care | Source: Ambulatory Visit | Attending: Family Medicine | Admitting: Family Medicine

## 2024-01-19 DIAGNOSIS — Z1231 Encounter for screening mammogram for malignant neoplasm of breast: Secondary | ICD-10-CM
